# Patient Record
Sex: Female | Born: 1940
Health system: Southern US, Community
[De-identification: ages and names within clinical notes are randomized; demographics above are authoritative.]

## PROBLEM LIST (undated history)

## (undated) DIAGNOSIS — J302 Other seasonal allergic rhinitis: Secondary | ICD-10-CM

## (undated) DIAGNOSIS — M810 Age-related osteoporosis without current pathological fracture: Secondary | ICD-10-CM

## (undated) DIAGNOSIS — I10 Essential (primary) hypertension: Secondary | ICD-10-CM

## (undated) HISTORY — PX: CHOLECYSTECTOMY: SHX55

## (undated) HISTORY — PX: COLONOSCOPY: SHX174

---

## 2001-06-24 ENCOUNTER — Ambulatory Visit (HOSPITAL_COMMUNITY): Admission: RE | Admit: 2001-06-24 | Discharge: 2001-06-24 | Payer: Self-pay | Admitting: Family Medicine

## 2001-06-24 ENCOUNTER — Encounter: Payer: Self-pay | Admitting: Family Medicine

## 2001-07-28 ENCOUNTER — Ambulatory Visit (HOSPITAL_COMMUNITY): Admission: RE | Admit: 2001-07-28 | Discharge: 2001-07-28 | Payer: Self-pay | Admitting: General Surgery

## 2002-09-26 ENCOUNTER — Ambulatory Visit (HOSPITAL_COMMUNITY): Admission: RE | Admit: 2002-09-26 | Discharge: 2002-09-26 | Payer: Self-pay | Admitting: Family Medicine

## 2002-09-26 ENCOUNTER — Encounter: Payer: Self-pay | Admitting: Family Medicine

## 2004-08-02 ENCOUNTER — Ambulatory Visit (HOSPITAL_COMMUNITY): Admission: RE | Admit: 2004-08-02 | Discharge: 2004-08-02 | Payer: Self-pay | Admitting: Family Medicine

## 2006-04-23 ENCOUNTER — Ambulatory Visit (HOSPITAL_COMMUNITY): Admission: RE | Admit: 2006-04-23 | Discharge: 2006-04-23 | Payer: Self-pay | Admitting: Family Medicine

## 2007-05-07 ENCOUNTER — Ambulatory Visit (HOSPITAL_COMMUNITY): Admission: RE | Admit: 2007-05-07 | Discharge: 2007-05-07 | Payer: Self-pay | Admitting: Family Medicine

## 2008-05-30 ENCOUNTER — Ambulatory Visit (HOSPITAL_COMMUNITY): Admission: RE | Admit: 2008-05-30 | Discharge: 2008-05-30 | Payer: Self-pay | Admitting: Family Medicine

## 2008-10-17 ENCOUNTER — Ambulatory Visit (HOSPITAL_COMMUNITY): Admission: RE | Admit: 2008-10-17 | Discharge: 2008-10-17 | Payer: Self-pay | Admitting: Family Medicine

## 2009-06-11 ENCOUNTER — Ambulatory Visit (HOSPITAL_COMMUNITY): Admission: RE | Admit: 2009-06-11 | Discharge: 2009-06-11 | Payer: Self-pay | Admitting: Family Medicine

## 2010-06-24 ENCOUNTER — Ambulatory Visit (HOSPITAL_COMMUNITY): Admission: RE | Admit: 2010-06-24 | Discharge: 2010-06-24 | Payer: Self-pay | Admitting: Family Medicine

## 2011-06-07 ENCOUNTER — Emergency Department (HOSPITAL_COMMUNITY)
Admission: EM | Admit: 2011-06-07 | Discharge: 2011-06-07 | Disposition: A | Discharge status: Home / Self Care | Payer: Medicare Other | Attending: Emergency Medicine | Admitting: Emergency Medicine

## 2011-06-07 DIAGNOSIS — S30860A Insect bite (nonvenomous) of lower back and pelvis, initial encounter: Secondary | ICD-10-CM | POA: Insufficient documentation

## 2011-06-07 DIAGNOSIS — W57XXXA Bitten or stung by nonvenomous insect and other nonvenomous arthropods, initial encounter: Secondary | ICD-10-CM | POA: Insufficient documentation

## 2011-06-07 DIAGNOSIS — E119 Type 2 diabetes mellitus without complications: Secondary | ICD-10-CM | POA: Insufficient documentation

## 2011-06-07 MED ORDER — DOXYCYCLINE HYCLATE 100 MG PO TABS
100.0000 mg | ORAL_TABLET | Freq: Once | ORAL | Status: AC
  Administered 2011-06-07: 100 mg via ORAL
  Filled 2011-06-07: qty 1

## 2011-06-07 MED ORDER — DOXYCYCLINE HYCLATE 100 MG PO CAPS: 100.0000 mg | ORAL_CAPSULE | Freq: Two times a day (BID) | ORAL | Status: AC

## 2011-06-07 NOTE — ED Notes (Signed)
Pt reports possible tick bite to the left side of her back.  Pt has been running a temperature since Thurs.  Pt reports fever up to 102 at home.  The area on pt's back is red and is in the shape of a circle.  Pt reports itching and discomfort.  Pt reports that the area has gotten bigger since Thursday.

## 2011-06-07 NOTE — ED Provider Notes (Signed)
History     CSN: [id number] Arrival date & time: 06/07/2011 10:38 AM  Chief Complaint  Patient presents with  . Insect Bite   HPI Comments: Bit by a tick ~ 1 week ago.  The history is provided by the patient. No language interpreter was used.    Past Medical History  Diagnosis Date  . Diabetes mellitus     History reviewed. No pertinent past surgical history.  No family history on file.  History  Substance Use Topics  . Smoking status: Never Smoker   . Smokeless tobacco: Not on file  . Alcohol Use: No    OB History    Grav Para Term Preterm Abortions TAB SAB Ect Mult Living                  Review of Systems  Skin:       erythema  All other systems reviewed and are negative.    Physical Exam  BP 152/73  Pulse 89  Temp(Src) 98.9 F (37.2 C) (Oral)  Resp 20  Ht 5\' 4"  (1.626 m)  Wt 140 lb (63.504 kg)  BMI 24.03 kg/m2  SpO2 100%  Physical Exam  Nursing note and vitals reviewed. Constitutional: She is oriented to person, place, and time. Vital signs are normal. She appears well-developed and well-nourished. No distress.  HENT:  Head: Normocephalic and atraumatic.  Right Ear: External ear normal.  Left Ear: External ear normal.  Nose: Nose normal.  Mouth/Throat: No oropharyngeal exudate.  Eyes: Conjunctivae and EOM are normal. Pupils are equal, round, and reactive to light. Right eye exhibits no discharge. Left eye exhibits no discharge. No scleral icterus.  Neck: Normal range of motion. Neck supple. No JVD present. No tracheal deviation present. No thyromegaly present.  Cardiovascular: Normal rate, regular rhythm, normal heart sounds, intact distal pulses and normal pulses.  Exam reveals no gallop and no friction rub.   No murmur heard. Pulmonary/Chest: Effort normal and breath sounds normal. No stridor. No respiratory distress. She has no wheezes. She has no rales. She exhibits no tenderness.  Abdominal: Soft. Normal appearance and bowel sounds are  normal. She exhibits no distension and no mass. There is no tenderness. There is no rebound and no guarding.  Musculoskeletal: Normal range of motion. She exhibits no edema and no tenderness.       Arms: Lymphadenopathy:    She has no cervical adenopathy.  Neurological: She is alert and oriented to person, place, and time. She has normal reflexes. No cranial nerve deficit. Coordination normal. GCS eye subscore is 4. GCS verbal subscore is 5. GCS motor subscore is 6.  Reflex Scores:      Tricep reflexes are 2+ on the right side and 2+ on the left side.      Bicep reflexes are 2+ on the right side and 2+ on the left side.      Brachioradialis reflexes are 2+ on the right side and 2+ on the left side.      Patellar reflexes are 2+ on the right side and 2+ on the left side.      Achilles reflexes are 2+ on the right side and 2+ on the left side. Skin: Skin is warm and dry. No rash noted. She is not diaphoretic.  Psychiatric: She has a normal mood and affect. Her speech is normal and behavior is normal. Judgment and thought content normal. Cognition and memory are normal.    ED Course  Procedures  MDM  Worthy Rancher, PA 06/07/11 1204  Medical screening examination/treatment/procedure(s) were performed by non-physician practitioner and as supervising physician I was immediately available for consultation/collaboration.  Cyndra Numbers, MD 09/01/11 1020

## 2011-06-17 NOTE — ED Provider Notes (Signed)
Medical screening examination/treatment/procedure(s) were performed by non-physician practitioner and as supervising physician I was immediately available for consultation/collaboration.  Cyndra Numbers, MD 06/17/11 [phone]

## 2011-07-08 ENCOUNTER — Other Ambulatory Visit (HOSPITAL_COMMUNITY): Payer: Self-pay | Admitting: Family Medicine

## 2011-07-08 DIAGNOSIS — Z139 Encounter for screening, unspecified: Secondary | ICD-10-CM

## 2011-07-17 ENCOUNTER — Ambulatory Visit (HOSPITAL_COMMUNITY): Payer: Medicare Other

## 2011-07-22 ENCOUNTER — Ambulatory Visit (HOSPITAL_COMMUNITY)
Admission: RE | Admit: 2011-07-22 | Discharge: 2011-07-22 | Disposition: A | Discharge status: Home / Self Care | Payer: Medicare Other | Source: Ambulatory Visit | Attending: Family Medicine | Admitting: Family Medicine

## 2011-07-22 DIAGNOSIS — Z1231 Encounter for screening mammogram for malignant neoplasm of breast: Secondary | ICD-10-CM | POA: Insufficient documentation

## 2011-07-22 DIAGNOSIS — Z139 Encounter for screening, unspecified: Secondary | ICD-10-CM

## 2012-05-25 DIAGNOSIS — Z6825 Body mass index (BMI) 25.0-25.9, adult: Secondary | ICD-10-CM | POA: Diagnosis not present

## 2012-05-25 DIAGNOSIS — J019 Acute sinusitis, unspecified: Secondary | ICD-10-CM | POA: Diagnosis not present

## 2012-05-25 DIAGNOSIS — J069 Acute upper respiratory infection, unspecified: Secondary | ICD-10-CM | POA: Diagnosis not present

## 2012-06-16 ENCOUNTER — Other Ambulatory Visit (HOSPITAL_COMMUNITY): Payer: Self-pay | Admitting: Family Medicine

## 2012-06-16 DIAGNOSIS — Z139 Encounter for screening, unspecified: Secondary | ICD-10-CM

## 2012-06-24 DIAGNOSIS — E119 Type 2 diabetes mellitus without complications: Secondary | ICD-10-CM | POA: Diagnosis not present

## 2012-07-26 ENCOUNTER — Ambulatory Visit (HOSPITAL_COMMUNITY)
Admission: RE | Admit: 2012-07-26 | Discharge: 2012-07-26 | Disposition: A | Discharge status: Home / Self Care | Payer: Medicare Other | Source: Ambulatory Visit | Attending: Family Medicine | Admitting: Family Medicine

## 2012-07-26 DIAGNOSIS — Z139 Encounter for screening, unspecified: Secondary | ICD-10-CM

## 2012-07-26 DIAGNOSIS — Z1231 Encounter for screening mammogram for malignant neoplasm of breast: Secondary | ICD-10-CM | POA: Insufficient documentation

## 2012-08-03 ENCOUNTER — Other Ambulatory Visit (HOSPITAL_COMMUNITY): Payer: Self-pay | Admitting: Family Medicine

## 2012-08-03 DIAGNOSIS — Z6825 Body mass index (BMI) 25.0-25.9, adult: Secondary | ICD-10-CM | POA: Diagnosis not present

## 2012-08-03 DIAGNOSIS — Z01419 Encounter for gynecological examination (general) (routine) without abnormal findings: Secondary | ICD-10-CM

## 2012-08-03 DIAGNOSIS — Z0000 Encounter for general adult medical examination without abnormal findings: Secondary | ICD-10-CM | POA: Diagnosis not present

## 2012-08-03 DIAGNOSIS — I10 Essential (primary) hypertension: Secondary | ICD-10-CM | POA: Diagnosis not present

## 2012-08-03 DIAGNOSIS — Z139 Encounter for screening, unspecified: Secondary | ICD-10-CM

## 2012-08-03 DIAGNOSIS — E785 Hyperlipidemia, unspecified: Secondary | ICD-10-CM | POA: Diagnosis not present

## 2012-08-03 DIAGNOSIS — E119 Type 2 diabetes mellitus without complications: Secondary | ICD-10-CM | POA: Diagnosis not present

## 2012-08-05 DIAGNOSIS — R7989 Other specified abnormal findings of blood chemistry: Secondary | ICD-10-CM | POA: Diagnosis not present

## 2012-08-10 ENCOUNTER — Ambulatory Visit (HOSPITAL_COMMUNITY)
Admission: RE | Admit: 2012-08-10 | Discharge: 2012-08-10 | Disposition: A | Discharge status: Home / Self Care | Payer: Medicare Other | Source: Ambulatory Visit | Attending: Family Medicine | Admitting: Family Medicine

## 2012-08-10 DIAGNOSIS — M899 Disorder of bone, unspecified: Secondary | ICD-10-CM | POA: Diagnosis not present

## 2012-08-10 DIAGNOSIS — Z139 Encounter for screening, unspecified: Secondary | ICD-10-CM

## 2012-08-10 DIAGNOSIS — M810 Age-related osteoporosis without current pathological fracture: Secondary | ICD-10-CM | POA: Insufficient documentation

## 2012-08-10 DIAGNOSIS — Z01419 Encounter for gynecological examination (general) (routine) without abnormal findings: Secondary | ICD-10-CM

## 2012-08-13 ENCOUNTER — Encounter (INDEPENDENT_AMBULATORY_CARE_PROVIDER_SITE_OTHER): Payer: Self-pay | Admitting: *Deleted

## 2012-10-04 DIAGNOSIS — E210 Primary hyperparathyroidism: Secondary | ICD-10-CM | POA: Diagnosis not present

## 2012-10-07 DIAGNOSIS — E210 Primary hyperparathyroidism: Secondary | ICD-10-CM | POA: Diagnosis not present

## 2012-11-19 ENCOUNTER — Encounter (INDEPENDENT_AMBULATORY_CARE_PROVIDER_SITE_OTHER): Payer: Self-pay | Admitting: *Deleted

## 2012-11-19 ENCOUNTER — Telehealth (INDEPENDENT_AMBULATORY_CARE_PROVIDER_SITE_OTHER): Payer: Self-pay | Admitting: *Deleted

## 2012-11-19 ENCOUNTER — Other Ambulatory Visit (INDEPENDENT_AMBULATORY_CARE_PROVIDER_SITE_OTHER): Payer: Self-pay | Admitting: *Deleted

## 2012-11-19 DIAGNOSIS — Z1211 Encounter for screening for malignant neoplasm of colon: Secondary | ICD-10-CM

## 2012-11-19 MED ORDER — PEG-KCL-NACL-NASULF-NA ASC-C 100 G PO SOLR: 1.0000 | Freq: Once | ORAL | Status: DC

## 2012-11-19 NOTE — Telephone Encounter (Signed)
Patient needs movi prep 

## 2012-12-01 ENCOUNTER — Telehealth (INDEPENDENT_AMBULATORY_CARE_PROVIDER_SITE_OTHER): Payer: Self-pay | Admitting: *Deleted

## 2012-12-01 NOTE — Telephone Encounter (Signed)
  Procedure: tcs  Reason/Indication:  Screening, fam hx colon ca  Has patient had this procedure before?  Yes, 11 years ago  If so, when, by whom and where?    Is there a family history of colon cancer?  Yes, mother  Who?  What age when diagnosed?    Is patient diabetic?   Yes, no meds, diet controlled      Does patient have prosthetic heart valve?  no  Do you have a pacemaker?  no  Has patient had joint replacement within last 12 months?  no  Is patient on Coumadin, Plavix and/or Aspirin? no  Medications: lisinopril 5 mg daily  Allergies: see EPIC  Medication Adjustment:   Procedure date & time: 12/30/12 at 830

## 2012-12-02 NOTE — Telephone Encounter (Signed)
agree

## 2012-12-20 ENCOUNTER — Encounter (HOSPITAL_COMMUNITY): Payer: Self-pay | Admitting: Pharmacy Technician

## 2012-12-29 MED ORDER — SODIUM CHLORIDE 0.45 % IV SOLN: INTRAVENOUS | Status: DC

## 2012-12-30 ENCOUNTER — Encounter (HOSPITAL_COMMUNITY)
Admission: RE | Disposition: A | Discharge status: Home / Self Care | Payer: Self-pay | Source: Ambulatory Visit | Attending: Internal Medicine

## 2012-12-30 ENCOUNTER — Ambulatory Visit (HOSPITAL_COMMUNITY)
Admission: RE | Admit: 2012-12-30 | Discharge: 2012-12-30 | Disposition: A | Discharge status: Home / Self Care | Payer: Medicare Other | Source: Ambulatory Visit | Attending: Internal Medicine | Admitting: Internal Medicine

## 2012-12-30 ENCOUNTER — Encounter (HOSPITAL_COMMUNITY): Payer: Self-pay | Admitting: *Deleted

## 2012-12-30 DIAGNOSIS — Z01812 Encounter for preprocedural laboratory examination: Secondary | ICD-10-CM | POA: Diagnosis not present

## 2012-12-30 DIAGNOSIS — E119 Type 2 diabetes mellitus without complications: Secondary | ICD-10-CM | POA: Insufficient documentation

## 2012-12-30 DIAGNOSIS — I10 Essential (primary) hypertension: Secondary | ICD-10-CM | POA: Diagnosis not present

## 2012-12-30 DIAGNOSIS — Z800 Family history of malignant neoplasm of digestive organs: Secondary | ICD-10-CM | POA: Insufficient documentation

## 2012-12-30 DIAGNOSIS — Z1211 Encounter for screening for malignant neoplasm of colon: Secondary | ICD-10-CM | POA: Diagnosis not present

## 2012-12-30 HISTORY — PX: COLONOSCOPY: SHX5424

## 2012-12-30 HISTORY — DX: Age-related osteoporosis without current pathological fracture: M81.0

## 2012-12-30 HISTORY — DX: Other seasonal allergic rhinitis: J30.2

## 2012-12-30 HISTORY — DX: Essential (primary) hypertension: I10

## 2012-12-30 LAB — GLUCOSE, CAPILLARY: Glucose-Capillary: 78 mg/dL (ref 70–99)

## 2012-12-30 SURGERY — COLONOSCOPY
Anesthesia: Moderate Sedation

## 2012-12-30 MED ORDER — MIDAZOLAM HCL 5 MG/5ML IJ SOLN
INTRAMUSCULAR | Status: DC | PRN
  Administered 2012-12-30 (×2): 2 mg via INTRAVENOUS
  Administered 2012-12-30: 1 mg via INTRAVENOUS

## 2012-12-30 MED ORDER — MIDAZOLAM HCL 5 MG/5ML IJ SOLN
INTRAMUSCULAR | Status: AC
  Filled 2012-12-30: qty 10

## 2012-12-30 MED ORDER — MEPERIDINE HCL 50 MG/ML IJ SOLN
INTRAMUSCULAR | Status: DC | PRN
  Administered 2012-12-30 (×2): 25 mg via INTRAVENOUS

## 2012-12-30 MED ORDER — STERILE WATER FOR IRRIGATION IR SOLN
Status: DC | PRN
  Administered 2012-12-30: 08:00:00

## 2012-12-30 MED ORDER — MEPERIDINE HCL 50 MG/ML IJ SOLN
INTRAMUSCULAR | Status: AC
  Filled 2012-12-30: qty 1

## 2012-12-30 MED ORDER — SODIUM CHLORIDE 0.9 % IV SOLN
INTRAVENOUS | Status: DC
  Administered 2012-12-30: 08:00:00 via INTRAVENOUS

## 2012-12-30 NOTE — H&P (Signed)
Bethany Herring is an 72 y.o. female.   Chief Complaint: Patient is here for colonoscopy. HPI: Patient is 72 year old Caucasian female is here for screening colonoscopy. Her last exam was 11 years ago. She denies change in her bowel habits or rectal bleeding. At times she does past clear mucus per rectum. Her mother was diagnosed with colon carcinoma when she was in her late 8s. No other member of the family has had colonic polyps or carcinoma.  Past Medical History  Diagnosis Date  . Hypertension   . Osteoporosis   . Seasonal allergies   . Diabetes mellitus     Diet controlled    Past Surgical History  Procedure Laterality Date  . Cholecystectomy    . Colonoscopy      Family History  Problem Relation Age of Onset  . Colon cancer Mother    Social History:  reports that she has never smoked. She does not have any smokeless tobacco history on file. She reports that  drinks alcohol. She reports that she does not use illicit drugs.  Allergies:  Allergies  Allergen Reactions  . Amoxicillin Hives  . Cortisone     Highly elevated blood sugars   . Glucotrol (Glipizide)     Medications Prior to Admission  Medication Sig Dispense Refill  . calcium carbonate (TUMS - DOSED IN MG ELEMENTAL CALCIUM) 500 MG chewable tablet Chew 1 tablet by mouth 2 (two) times daily as needed for heartburn.      . cholecalciferol (VITAMIN D) 400 UNITS TABS Take 400 Units by mouth daily.        Marland Kitchen CRANBERRY PO Take 1 tablet by mouth daily.      Marland Kitchen lisinopril (PRINIVIL,ZESTRIL) 5 MG tablet Take 5 mg by mouth daily.        Marland Kitchen OVER THE COUNTER MEDICATION Take 5 mLs by mouth daily. 1 teaspoon before breakfast. Powdered form Raw Barley grass      . Probiotic Product (ALIGN) 4 MG CAPS Take 1 tablet by mouth daily.        . vitamin C (ASCORBIC ACID) 500 MG tablet Take 500 mg by mouth daily.          Results for orders placed during the hospital encounter of 12/30/12 (from the past 48 hour(s))  GLUCOSE,  CAPILLARY     Status: None   Collection Time    12/30/12  8:08 AM      Result Value Range   Glucose-Capillary 78  70 - 99 mg/dL   Comment 1 Documented in Chart     No results found.  ROS  Blood pressure 158/77, pulse 90, temperature 97.8 F (36.6 C), temperature source Oral, resp. rate 18, height 5\' 3"  (1.6 m), weight 140 lb (63.504 kg), SpO2 99.00%. Physical Exam  Constitutional: She appears well-developed and well-nourished.  HENT:  Mouth/Throat: Oropharynx is clear and moist.  Eyes: Conjunctivae are normal. No scleral icterus.  Neck: No thyromegaly present.  Cardiovascular: Normal rate, regular rhythm and normal heart sounds.   No murmur heard. Respiratory: Effort normal and breath sounds normal.  GI: Soft. She exhibits no distension and no mass. There is no tenderness.  Long right subcostal scar  Musculoskeletal: She exhibits no edema.  Lymphadenopathy:    She has no cervical adenopathy.  Neurological: She is alert.  Skin: Skin is warm and dry.     Assessment/Plan Average risk screening colonoscopy. Family history of colon carcinoma in a first-degree relative at late onset.  REHMAN,NAJEEB U 12/30/2012,  8:32 AM

## 2012-12-30 NOTE — Op Note (Signed)
COLONOSCOPY PROCEDURE REPORT  PATIENT:  Bethany Herring  MR#:  [id number] Birthdate:  18-Nov-1940, 72 y.o., female Endoscopist:  Dr. Malissa Hippo, MD Referred By:  Dr. Colette Ribas, MD  Procedure Date: 12/30/2012  Procedure:   Colonoscopy  Indications:  Patient is 72 year old Caucasian female was undergoing screening colonoscopy. Her mother was diagnosed with colon carcinoma in her mid 48s. Patient's last colonoscopy was 11 years ago and was unremarkable.  Informed Consent:  The procedure and risks were reviewed with the patient and informed consent was obtained.  Medications:  Demerol 50 mg IV Versed 5 mg IV  Description of procedure:  After a digital rectal exam was performed, that colonoscope was advanced from the anus through the rectum and colon to the area of the cecum, ileocecal valve and appendiceal orifice. The cecum was deeply intubated. These structures were well-seen and photographed for the record. From the level of the cecum and ileocecal valve, the scope was slowly and cautiously withdrawn. The mucosal surfaces were carefully surveyed utilizing scope tip to flexion to facilitate fold flattening as needed. The scope was pulled down into the rectum where a thorough exam including retroflexion was performed.  Findings:   Prep satisfactory. She had a lot of liquid stool scattered throughout the colon. Normal mucosa throughout. No polyps or other abnormalities noted. Normal rectal mucosa and anal rectal junction.   Therapeutic/Diagnostic Maneuvers Performed:  None  Complications:  None  Cecal Withdrawal Time:  11 minutes  Impression:  Normal colonoscopy.  Recommendations:  Standard instructions given. Next screening exam in 10 years unless family history changes.  Ashland Wiseman U  12/30/2012 9:11 AM  CC: Dr. Phillips Odor, Chancy Hurter, MD & Dr. Bonnetta Barry ref. provider found

## 2013-01-03 ENCOUNTER — Encounter (HOSPITAL_COMMUNITY): Payer: Self-pay | Admitting: Internal Medicine

## 2013-08-01 DIAGNOSIS — E119 Type 2 diabetes mellitus without complications: Secondary | ICD-10-CM | POA: Diagnosis not present

## 2013-09-15 DIAGNOSIS — Z23 Encounter for immunization: Secondary | ICD-10-CM | POA: Diagnosis not present

## 2013-10-11 DIAGNOSIS — I10 Essential (primary) hypertension: Secondary | ICD-10-CM | POA: Diagnosis not present

## 2013-10-11 DIAGNOSIS — Z6826 Body mass index (BMI) 26.0-26.9, adult: Secondary | ICD-10-CM | POA: Diagnosis not present

## 2013-10-11 DIAGNOSIS — E785 Hyperlipidemia, unspecified: Secondary | ICD-10-CM | POA: Diagnosis not present

## 2014-07-17 DIAGNOSIS — E119 Type 2 diabetes mellitus without complications: Secondary | ICD-10-CM | POA: Diagnosis not present

## 2015-03-06 DIAGNOSIS — I10 Essential (primary) hypertension: Secondary | ICD-10-CM | POA: Diagnosis not present

## 2015-03-06 DIAGNOSIS — R7309 Other abnormal glucose: Secondary | ICD-10-CM | POA: Diagnosis not present

## 2015-03-06 DIAGNOSIS — Z1389 Encounter for screening for other disorder: Secondary | ICD-10-CM | POA: Diagnosis not present

## 2015-03-06 DIAGNOSIS — Z23 Encounter for immunization: Secondary | ICD-10-CM | POA: Diagnosis not present

## 2015-03-06 DIAGNOSIS — Z0000 Encounter for general adult medical examination without abnormal findings: Secondary | ICD-10-CM | POA: Diagnosis not present

## 2015-06-12 ENCOUNTER — Other Ambulatory Visit (HOSPITAL_COMMUNITY): Payer: Self-pay | Admitting: Family Medicine

## 2015-06-12 DIAGNOSIS — Z1231 Encounter for screening mammogram for malignant neoplasm of breast: Secondary | ICD-10-CM

## 2015-06-20 ENCOUNTER — Ambulatory Visit (HOSPITAL_COMMUNITY)
Admission: RE | Admit: 2015-06-20 | Discharge: 2015-06-20 | Disposition: A | Discharge status: Home / Self Care | Payer: Medicare Other | Source: Ambulatory Visit | Attending: Family Medicine | Admitting: Family Medicine

## 2015-06-20 DIAGNOSIS — Z1231 Encounter for screening mammogram for malignant neoplasm of breast: Secondary | ICD-10-CM | POA: Insufficient documentation

## 2015-11-14 DIAGNOSIS — E11319 Type 2 diabetes mellitus with unspecified diabetic retinopathy without macular edema: Secondary | ICD-10-CM | POA: Diagnosis not present

## 2016-05-09 ENCOUNTER — Other Ambulatory Visit (HOSPITAL_COMMUNITY): Payer: Self-pay | Admitting: Family Medicine

## 2016-05-09 DIAGNOSIS — Z1231 Encounter for screening mammogram for malignant neoplasm of breast: Secondary | ICD-10-CM

## 2016-05-13 DIAGNOSIS — E569 Vitamin deficiency, unspecified: Secondary | ICD-10-CM | POA: Diagnosis not present

## 2016-05-13 DIAGNOSIS — E119 Type 2 diabetes mellitus without complications: Secondary | ICD-10-CM | POA: Diagnosis not present

## 2016-05-13 DIAGNOSIS — N189 Chronic kidney disease, unspecified: Secondary | ICD-10-CM | POA: Diagnosis not present

## 2016-05-13 DIAGNOSIS — E559 Vitamin D deficiency, unspecified: Secondary | ICD-10-CM | POA: Diagnosis not present

## 2016-05-13 DIAGNOSIS — Z1389 Encounter for screening for other disorder: Secondary | ICD-10-CM | POA: Diagnosis not present

## 2016-05-13 DIAGNOSIS — Z0000 Encounter for general adult medical examination without abnormal findings: Secondary | ICD-10-CM | POA: Diagnosis not present

## 2016-05-13 DIAGNOSIS — L720 Epidermal cyst: Secondary | ICD-10-CM | POA: Diagnosis not present

## 2016-05-13 DIAGNOSIS — Z6826 Body mass index (BMI) 26.0-26.9, adult: Secondary | ICD-10-CM | POA: Diagnosis not present

## 2016-05-13 DIAGNOSIS — E782 Mixed hyperlipidemia: Secondary | ICD-10-CM | POA: Diagnosis not present

## 2016-05-13 DIAGNOSIS — E663 Overweight: Secondary | ICD-10-CM | POA: Diagnosis not present

## 2016-06-12 DIAGNOSIS — S1086XA Insect bite of other specified part of neck, initial encounter: Secondary | ICD-10-CM | POA: Diagnosis not present

## 2016-06-12 DIAGNOSIS — M67432 Ganglion, left wrist: Secondary | ICD-10-CM | POA: Diagnosis not present

## 2016-06-12 DIAGNOSIS — M71341 Other bursal cyst, right hand: Secondary | ICD-10-CM | POA: Diagnosis not present

## 2016-06-12 DIAGNOSIS — M67441 Ganglion, right hand: Secondary | ICD-10-CM | POA: Diagnosis not present

## 2016-06-20 ENCOUNTER — Ambulatory Visit (HOSPITAL_COMMUNITY): Payer: Medicare Other

## 2016-07-07 ENCOUNTER — Ambulatory Visit (HOSPITAL_COMMUNITY)
Admission: RE | Admit: 2016-07-07 | Discharge: 2016-07-07 | Disposition: A | Discharge status: Home / Self Care | Payer: Medicare Other | Source: Ambulatory Visit | Attending: Family Medicine | Admitting: Family Medicine

## 2016-07-07 DIAGNOSIS — Z1231 Encounter for screening mammogram for malignant neoplasm of breast: Secondary | ICD-10-CM | POA: Diagnosis not present

## 2016-10-22 DIAGNOSIS — Z23 Encounter for immunization: Secondary | ICD-10-CM | POA: Diagnosis not present

## 2017-02-04 DIAGNOSIS — N189 Chronic kidney disease, unspecified: Secondary | ICD-10-CM | POA: Diagnosis not present

## 2017-02-04 DIAGNOSIS — E119 Type 2 diabetes mellitus without complications: Secondary | ICD-10-CM | POA: Diagnosis not present

## 2017-02-04 DIAGNOSIS — I10 Essential (primary) hypertension: Secondary | ICD-10-CM | POA: Diagnosis not present

## 2017-02-04 DIAGNOSIS — E569 Vitamin deficiency, unspecified: Secondary | ICD-10-CM | POA: Diagnosis not present

## 2017-02-04 DIAGNOSIS — D559 Anemia due to enzyme disorder, unspecified: Secondary | ICD-10-CM | POA: Diagnosis not present

## 2017-02-04 DIAGNOSIS — Z6825 Body mass index (BMI) 25.0-25.9, adult: Secondary | ICD-10-CM | POA: Diagnosis not present

## 2017-02-04 DIAGNOSIS — E782 Mixed hyperlipidemia: Secondary | ICD-10-CM | POA: Diagnosis not present

## 2017-02-04 DIAGNOSIS — M810 Age-related osteoporosis without current pathological fracture: Secondary | ICD-10-CM | POA: Diagnosis not present

## 2017-02-04 DIAGNOSIS — E663 Overweight: Secondary | ICD-10-CM | POA: Diagnosis not present

## 2017-03-31 DIAGNOSIS — Z961 Presence of intraocular lens: Secondary | ICD-10-CM | POA: Diagnosis not present

## 2017-03-31 DIAGNOSIS — H2703 Aphakia, bilateral: Secondary | ICD-10-CM | POA: Diagnosis not present

## 2017-03-31 DIAGNOSIS — H40013 Open angle with borderline findings, low risk, bilateral: Secondary | ICD-10-CM | POA: Diagnosis not present

## 2017-07-01 ENCOUNTER — Other Ambulatory Visit (HOSPITAL_COMMUNITY): Payer: Self-pay | Admitting: Family Medicine

## 2017-07-01 DIAGNOSIS — Z1231 Encounter for screening mammogram for malignant neoplasm of breast: Secondary | ICD-10-CM

## 2017-07-27 ENCOUNTER — Encounter (HOSPITAL_COMMUNITY): Payer: Self-pay

## 2017-07-27 ENCOUNTER — Ambulatory Visit (HOSPITAL_COMMUNITY)
Admission: RE | Admit: 2017-07-27 | Discharge: 2017-07-27 | Disposition: A | Discharge status: Home / Self Care | Payer: Medicare Other | Source: Ambulatory Visit | Attending: Family Medicine | Admitting: Family Medicine

## 2017-07-27 DIAGNOSIS — Z1231 Encounter for screening mammogram for malignant neoplasm of breast: Secondary | ICD-10-CM | POA: Diagnosis not present

## 2017-10-01 DIAGNOSIS — Z23 Encounter for immunization: Secondary | ICD-10-CM | POA: Diagnosis not present

## 2017-11-11 DIAGNOSIS — E119 Type 2 diabetes mellitus without complications: Secondary | ICD-10-CM | POA: Diagnosis not present

## 2017-11-11 DIAGNOSIS — E663 Overweight: Secondary | ICD-10-CM | POA: Diagnosis not present

## 2017-11-11 DIAGNOSIS — Z6826 Body mass index (BMI) 26.0-26.9, adult: Secondary | ICD-10-CM | POA: Diagnosis not present

## 2017-11-11 DIAGNOSIS — R5383 Other fatigue: Secondary | ICD-10-CM | POA: Diagnosis not present

## 2017-11-11 DIAGNOSIS — R05 Cough: Secondary | ICD-10-CM | POA: Diagnosis not present

## 2017-11-11 DIAGNOSIS — Z1389 Encounter for screening for other disorder: Secondary | ICD-10-CM | POA: Diagnosis not present

## 2017-11-11 DIAGNOSIS — Z0000 Encounter for general adult medical examination without abnormal findings: Secondary | ICD-10-CM | POA: Diagnosis not present

## 2017-11-11 DIAGNOSIS — I10 Essential (primary) hypertension: Secondary | ICD-10-CM | POA: Diagnosis not present

## 2017-11-11 DIAGNOSIS — J029 Acute pharyngitis, unspecified: Secondary | ICD-10-CM | POA: Diagnosis not present

## 2017-11-11 DIAGNOSIS — R51 Headache: Secondary | ICD-10-CM | POA: Diagnosis not present

## 2018-02-09 DIAGNOSIS — E663 Overweight: Secondary | ICD-10-CM | POA: Diagnosis not present

## 2018-02-09 DIAGNOSIS — E782 Mixed hyperlipidemia: Secondary | ICD-10-CM | POA: Diagnosis not present

## 2018-02-09 DIAGNOSIS — Z23 Encounter for immunization: Secondary | ICD-10-CM | POA: Diagnosis not present

## 2018-02-09 DIAGNOSIS — I10 Essential (primary) hypertension: Secondary | ICD-10-CM | POA: Diagnosis not present

## 2018-02-09 DIAGNOSIS — E119 Type 2 diabetes mellitus without complications: Secondary | ICD-10-CM | POA: Diagnosis not present

## 2018-02-09 DIAGNOSIS — M810 Age-related osteoporosis without current pathological fracture: Secondary | ICD-10-CM | POA: Diagnosis not present

## 2018-02-09 DIAGNOSIS — Z79899 Other long term (current) drug therapy: Secondary | ICD-10-CM | POA: Diagnosis not present

## 2018-02-09 DIAGNOSIS — E569 Vitamin deficiency, unspecified: Secondary | ICD-10-CM | POA: Diagnosis not present

## 2018-02-09 DIAGNOSIS — N189 Chronic kidney disease, unspecified: Secondary | ICD-10-CM | POA: Diagnosis not present

## 2018-02-09 DIAGNOSIS — Z6825 Body mass index (BMI) 25.0-25.9, adult: Secondary | ICD-10-CM | POA: Diagnosis not present

## 2018-05-13 DIAGNOSIS — E119 Type 2 diabetes mellitus without complications: Secondary | ICD-10-CM | POA: Diagnosis not present

## 2018-05-13 DIAGNOSIS — Z961 Presence of intraocular lens: Secondary | ICD-10-CM | POA: Diagnosis not present

## 2018-05-13 DIAGNOSIS — H47311 Coloboma of optic disc, right eye: Secondary | ICD-10-CM | POA: Diagnosis not present

## 2018-09-01 ENCOUNTER — Other Ambulatory Visit (HOSPITAL_COMMUNITY): Payer: Self-pay | Admitting: Family Medicine

## 2018-09-01 DIAGNOSIS — Z1231 Encounter for screening mammogram for malignant neoplasm of breast: Secondary | ICD-10-CM

## 2018-09-17 DIAGNOSIS — E119 Type 2 diabetes mellitus without complications: Secondary | ICD-10-CM | POA: Diagnosis not present

## 2018-09-17 DIAGNOSIS — Z1389 Encounter for screening for other disorder: Secondary | ICD-10-CM | POA: Diagnosis not present

## 2018-09-17 DIAGNOSIS — E663 Overweight: Secondary | ICD-10-CM | POA: Diagnosis not present

## 2018-09-17 DIAGNOSIS — I10 Essential (primary) hypertension: Secondary | ICD-10-CM | POA: Diagnosis not present

## 2018-09-17 DIAGNOSIS — E569 Vitamin deficiency, unspecified: Secondary | ICD-10-CM | POA: Diagnosis not present

## 2018-09-17 DIAGNOSIS — E782 Mixed hyperlipidemia: Secondary | ICD-10-CM | POA: Diagnosis not present

## 2018-09-17 DIAGNOSIS — Z6825 Body mass index (BMI) 25.0-25.9, adult: Secondary | ICD-10-CM | POA: Diagnosis not present

## 2018-09-17 DIAGNOSIS — Z23 Encounter for immunization: Secondary | ICD-10-CM | POA: Diagnosis not present

## 2018-09-27 ENCOUNTER — Ambulatory Visit (HOSPITAL_COMMUNITY)
Admission: RE | Admit: 2018-09-27 | Discharge: 2018-09-27 | Disposition: A | Discharge status: Home / Self Care | Payer: Medicare Other | Source: Ambulatory Visit | Attending: Family Medicine | Admitting: Family Medicine

## 2018-09-27 DIAGNOSIS — Z1231 Encounter for screening mammogram for malignant neoplasm of breast: Secondary | ICD-10-CM | POA: Diagnosis not present

## 2019-02-16 DIAGNOSIS — Z681 Body mass index (BMI) 19 or less, adult: Secondary | ICD-10-CM | POA: Diagnosis not present

## 2019-02-16 DIAGNOSIS — Z0000 Encounter for general adult medical examination without abnormal findings: Secondary | ICD-10-CM | POA: Diagnosis not present

## 2019-02-16 DIAGNOSIS — Z1389 Encounter for screening for other disorder: Secondary | ICD-10-CM | POA: Diagnosis not present

## 2019-08-11 DIAGNOSIS — Z23 Encounter for immunization: Secondary | ICD-10-CM | POA: Diagnosis not present

## 2019-08-11 DIAGNOSIS — E559 Vitamin D deficiency, unspecified: Secondary | ICD-10-CM | POA: Diagnosis not present

## 2019-08-11 DIAGNOSIS — E663 Overweight: Secondary | ICD-10-CM | POA: Diagnosis not present

## 2019-08-11 DIAGNOSIS — E119 Type 2 diabetes mellitus without complications: Secondary | ICD-10-CM | POA: Diagnosis not present

## 2019-08-11 DIAGNOSIS — E7849 Other hyperlipidemia: Secondary | ICD-10-CM | POA: Diagnosis not present

## 2019-08-11 DIAGNOSIS — Z6826 Body mass index (BMI) 26.0-26.9, adult: Secondary | ICD-10-CM | POA: Diagnosis not present

## 2019-08-11 DIAGNOSIS — I10 Essential (primary) hypertension: Secondary | ICD-10-CM | POA: Diagnosis not present

## 2019-12-03 ENCOUNTER — Ambulatory Visit: Payer: Medicare Other | Attending: Internal Medicine

## 2019-12-03 DIAGNOSIS — Z23 Encounter for immunization: Secondary | ICD-10-CM

## 2019-12-03 NOTE — Progress Notes (Signed)
   Covid-19 Vaccination Clinic  Name:  DEWEY NEUKAM    MRN: [id number] DOB: 05-30-1941  12/03/2019  Ms. Nordmann was observed post Covid-19 immunization for 15 minutes without incident. She was provided with Vaccine Information Sheet and instruction to access the V-Safe system.   Ms. Saperstein was instructed to call 911 with any severe reactions post vaccine: Marland Kitchen Difficulty breathing  . Swelling of face and throat  . A fast heartbeat  . A bad rash all over body  . Dizziness and weakness   Immunizations Administered    Name Date Dose VIS Date Route   Moderna COVID-19 Vaccine 12/03/2019  8:35 AM 0.5 mL 08/30/2019 Intramuscular   Manufacturer: Moderna   Lot: [id number]   NDC: 78676-720-94

## 2019-12-12 IMAGING — MG DIGITAL SCREENING BILATERAL MAMMOGRAM WITH TOMO AND CAD
8 series · 8 of 24 positions shown · non-contrast
Comparison: Previous exam(s).

CLINICAL DATA: Screening.

EXAM:
DIGITAL SCREENING BILATERAL MAMMOGRAM WITH TOMO AND CAD

[R CC synth-2D]
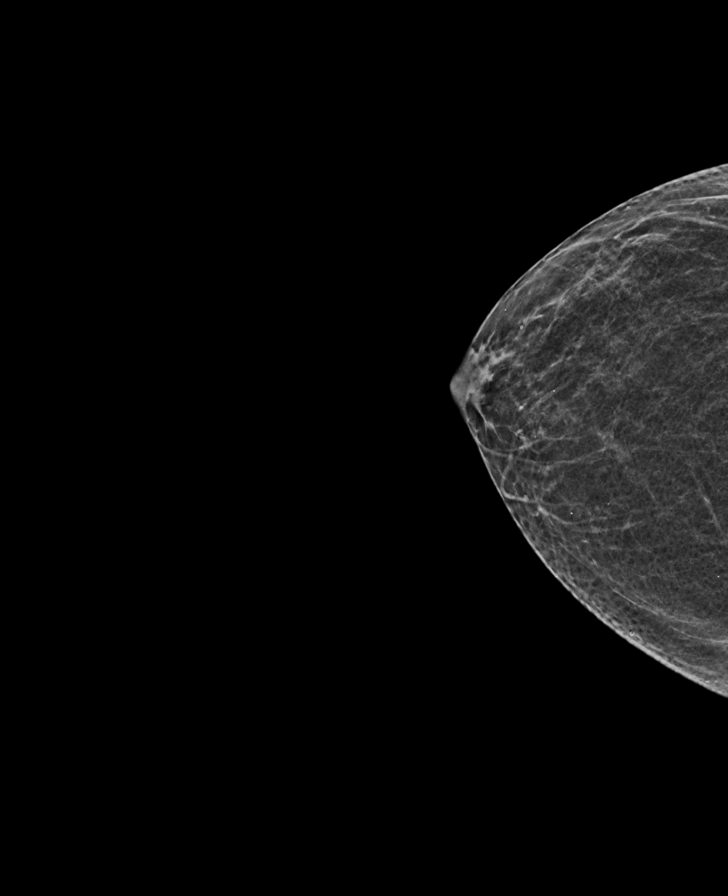

[L MLO synth-2D]
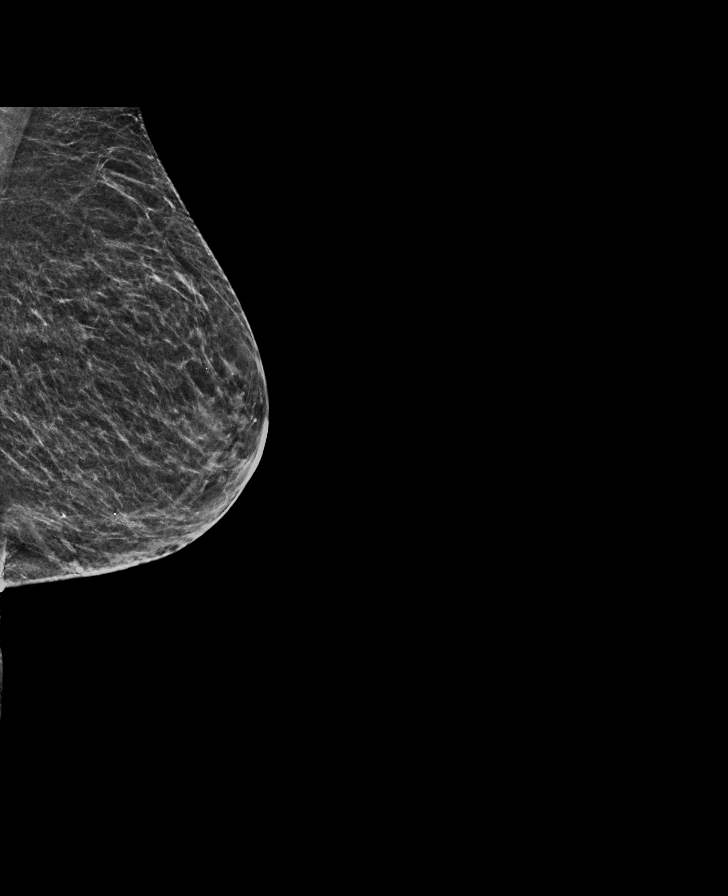

[R MLO synth-2D]
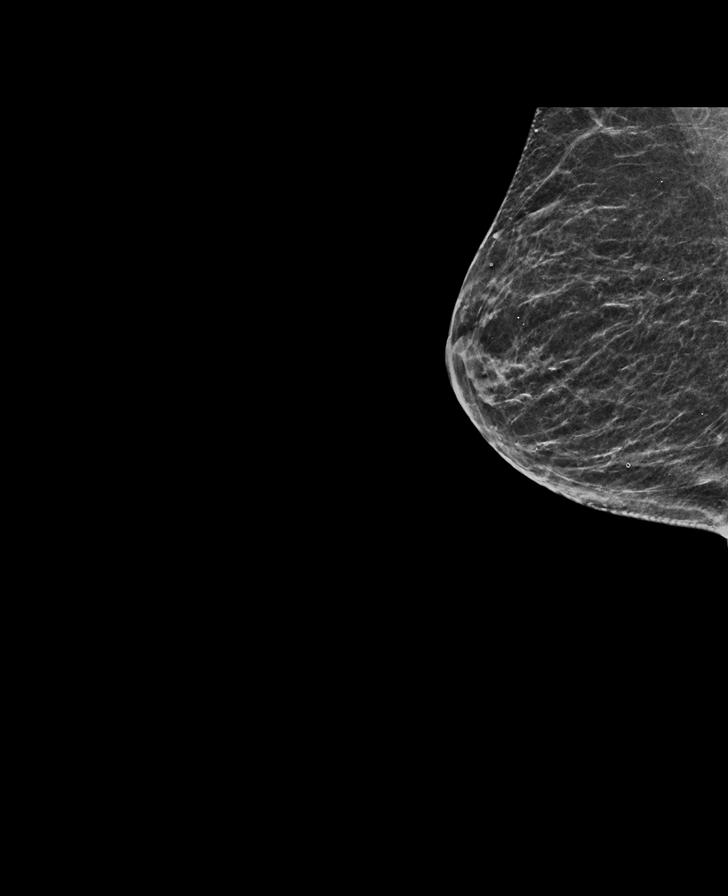

[L CC synth-2D]
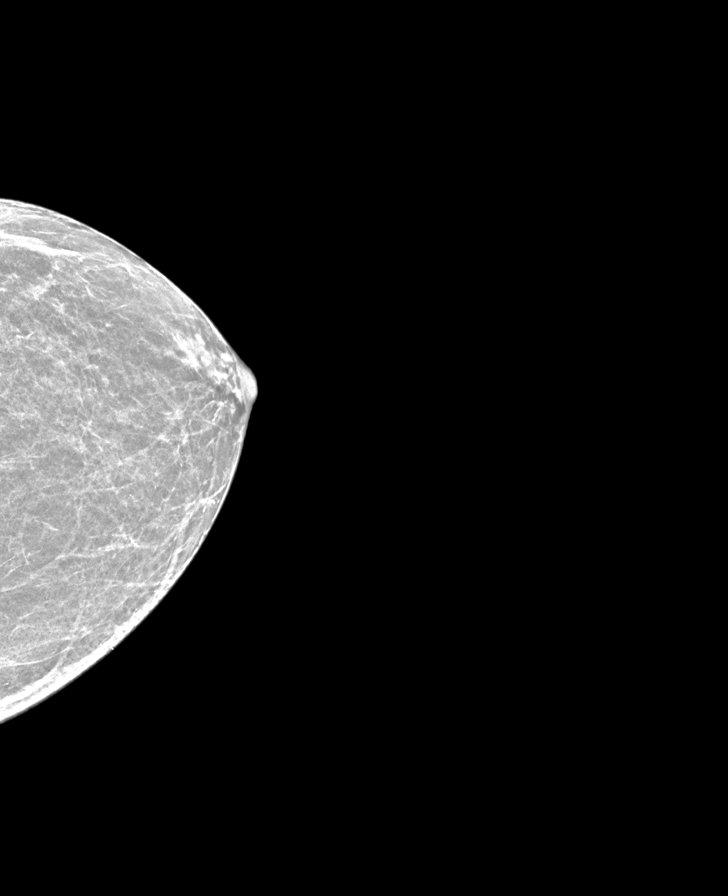

[L MLO tomo · tomo slice 25/48.0]
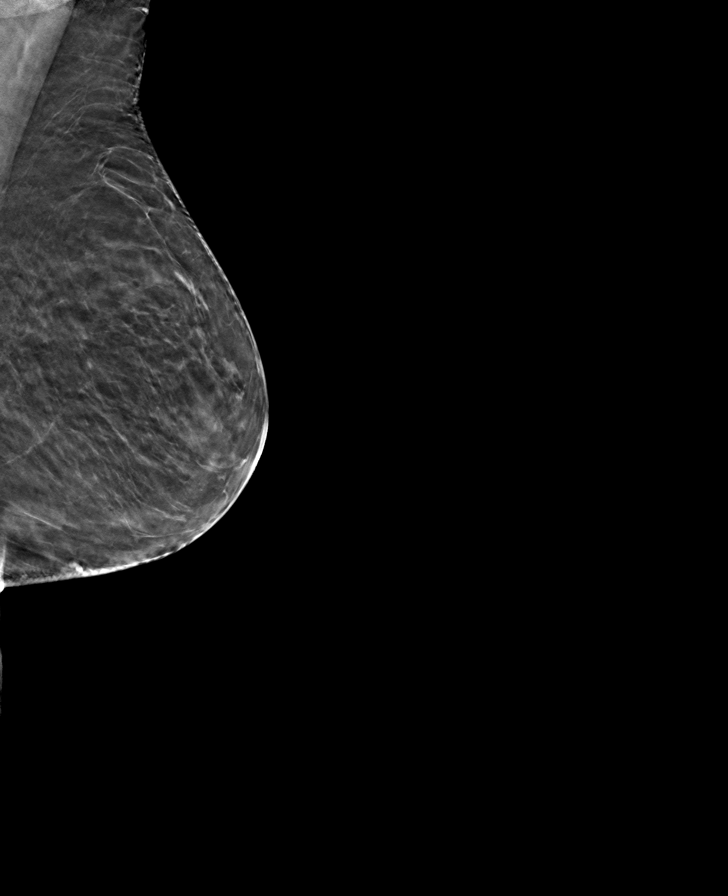

[R CC tomo · tomo slice 21/41.0]
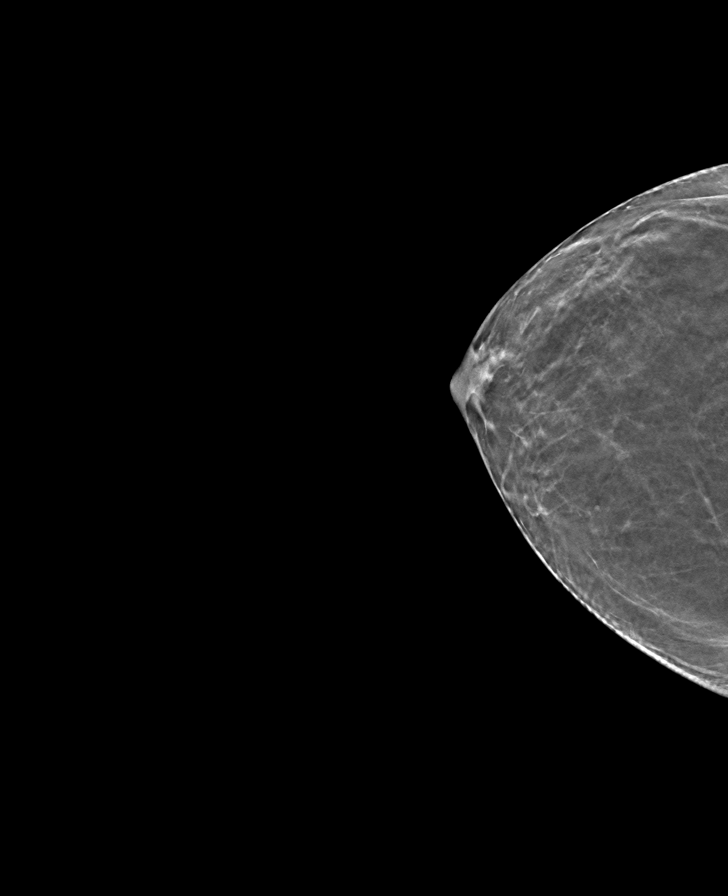

[L CC tomo · tomo slice 21/41.0]
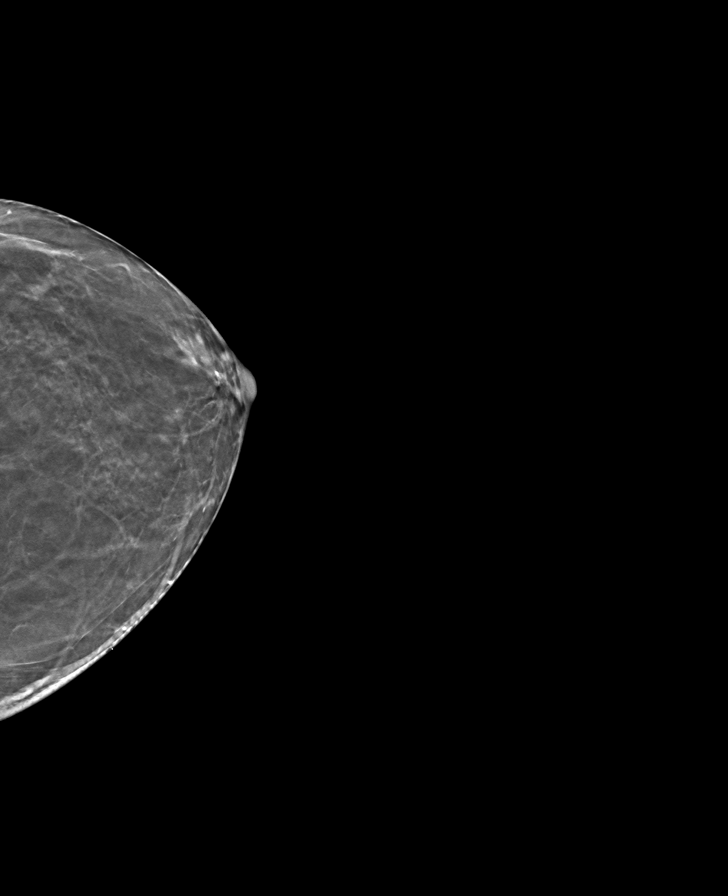

[R MLO tomo · tomo slice 25/48.0]
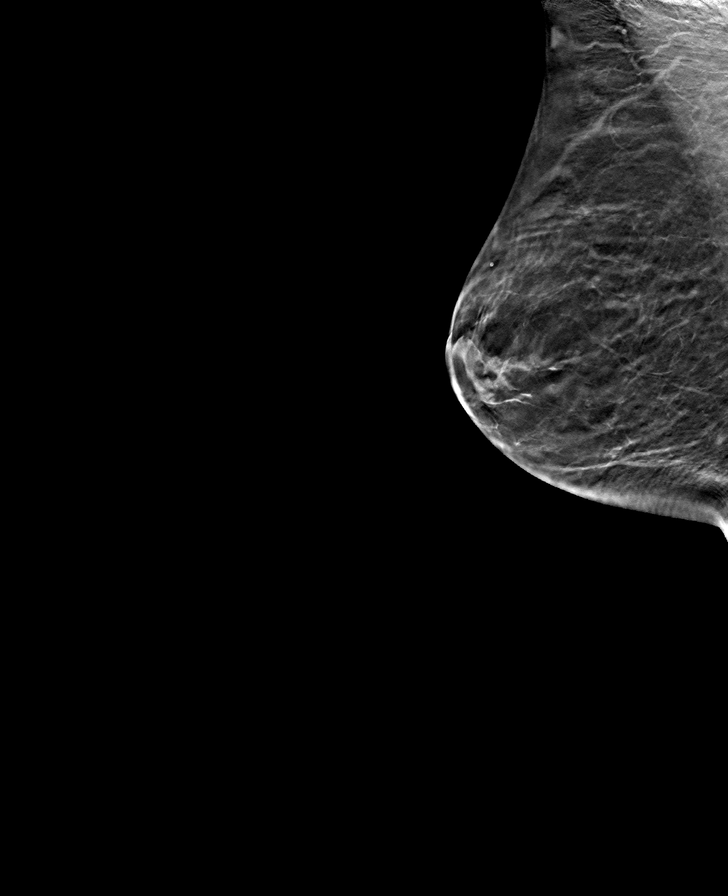

[8 of 24 positions shown; findings below may reference images not displayed]

ACR Breast Density Category b: There are scattered areas of
fibroglandular density.
FINDINGS: There are no findings suspicious for malignancy. Images were
processed with CAD.
IMPRESSION: No mammographic evidence of malignancy. A result letter of this
screening mammogram will be mailed directly to the patient.

RECOMMENDATION:
Screening mammogram in one year. (Code:[id number])

BI-RADS CATEGORY  1: Negative.

## 2020-01-04 ENCOUNTER — Ambulatory Visit: Payer: Medicare Other | Attending: Internal Medicine

## 2020-01-04 DIAGNOSIS — Z23 Encounter for immunization: Secondary | ICD-10-CM

## 2020-01-04 NOTE — Progress Notes (Signed)
   Covid-19 Vaccination Clinic  Name:  DELPHINA SCHUM    MRN: [id number] DOB: [birth year]  01/04/2020  Ms. Fleener was observed post Covid-19 immunization for 15 minutes without incident. She was provided with Vaccine Information Sheet and instruction to access the V-Safe system.   Ms. Fanguy was instructed to call 911 with any severe reactions post vaccine: Marland Kitchen Difficulty breathing  . Swelling of face and throat  . A fast heartbeat  . A bad rash all over body  . Dizziness and weakness   Immunizations Administered    Name Date Dose VIS Date Route   Moderna COVID-19 Vaccine 01/04/2020  8:49 AM 0.5 mL 08/30/2019 Intramuscular   Manufacturer: Gala Murdoch   Lot: [id number]   NDC: 91444-584-83

## 2020-03-07 DIAGNOSIS — R000 Tachycardia, unspecified: Secondary | ICD-10-CM | POA: Diagnosis not present

## 2020-03-07 DIAGNOSIS — Z1389 Encounter for screening for other disorder: Secondary | ICD-10-CM | POA: Diagnosis not present

## 2020-03-07 DIAGNOSIS — I10 Essential (primary) hypertension: Secondary | ICD-10-CM | POA: Diagnosis not present

## 2020-03-07 DIAGNOSIS — E559 Vitamin D deficiency, unspecified: Secondary | ICD-10-CM | POA: Diagnosis not present

## 2020-03-07 DIAGNOSIS — E7849 Other hyperlipidemia: Secondary | ICD-10-CM | POA: Diagnosis not present

## 2020-03-07 DIAGNOSIS — E663 Overweight: Secondary | ICD-10-CM | POA: Diagnosis not present

## 2020-03-07 DIAGNOSIS — Z0000 Encounter for general adult medical examination without abnormal findings: Secondary | ICD-10-CM | POA: Diagnosis not present

## 2020-03-07 DIAGNOSIS — Z6826 Body mass index (BMI) 26.0-26.9, adult: Secondary | ICD-10-CM | POA: Diagnosis not present

## 2020-03-07 DIAGNOSIS — E119 Type 2 diabetes mellitus without complications: Secondary | ICD-10-CM | POA: Diagnosis not present

## 2020-05-04 ENCOUNTER — Other Ambulatory Visit (HOSPITAL_COMMUNITY): Payer: Self-pay | Admitting: Family Medicine

## 2020-05-04 DIAGNOSIS — Z1231 Encounter for screening mammogram for malignant neoplasm of breast: Secondary | ICD-10-CM

## 2020-05-16 ENCOUNTER — Ambulatory Visit (HOSPITAL_COMMUNITY)
Admission: RE | Admit: 2020-05-16 | Discharge: 2020-05-16 | Disposition: A | Discharge status: Home / Self Care | Payer: Medicare Other | Source: Ambulatory Visit | Attending: Family Medicine | Admitting: Family Medicine

## 2020-05-16 ENCOUNTER — Other Ambulatory Visit: Payer: Self-pay

## 2020-05-16 DIAGNOSIS — Z1231 Encounter for screening mammogram for malignant neoplasm of breast: Secondary | ICD-10-CM | POA: Diagnosis not present

## 2020-08-01 DIAGNOSIS — E119 Type 2 diabetes mellitus without complications: Secondary | ICD-10-CM | POA: Diagnosis not present

## 2020-08-05 DIAGNOSIS — Z23 Encounter for immunization: Secondary | ICD-10-CM | POA: Diagnosis not present

## 2020-09-15 DIAGNOSIS — Z23 Encounter for immunization: Secondary | ICD-10-CM | POA: Diagnosis not present

## 2020-10-03 ENCOUNTER — Other Ambulatory Visit (HOSPITAL_COMMUNITY): Payer: Self-pay | Admitting: Family Medicine

## 2020-10-03 DIAGNOSIS — E7849 Other hyperlipidemia: Secondary | ICD-10-CM | POA: Diagnosis not present

## 2020-10-03 DIAGNOSIS — E663 Overweight: Secondary | ICD-10-CM | POA: Diagnosis not present

## 2020-10-03 DIAGNOSIS — I10 Essential (primary) hypertension: Secondary | ICD-10-CM | POA: Diagnosis not present

## 2020-10-03 DIAGNOSIS — Z6825 Body mass index (BMI) 25.0-25.9, adult: Secondary | ICD-10-CM | POA: Diagnosis not present

## 2020-10-03 DIAGNOSIS — E559 Vitamin D deficiency, unspecified: Secondary | ICD-10-CM | POA: Diagnosis not present

## 2020-10-03 DIAGNOSIS — E119 Type 2 diabetes mellitus without complications: Secondary | ICD-10-CM | POA: Diagnosis not present

## 2020-10-03 DIAGNOSIS — Z1331 Encounter for screening for depression: Secondary | ICD-10-CM | POA: Diagnosis not present

## 2020-10-03 DIAGNOSIS — E2839 Other primary ovarian failure: Secondary | ICD-10-CM

## 2020-10-03 DIAGNOSIS — Z1389 Encounter for screening for other disorder: Secondary | ICD-10-CM | POA: Diagnosis not present

## 2020-10-12 ENCOUNTER — Other Ambulatory Visit: Payer: Self-pay

## 2020-10-12 ENCOUNTER — Ambulatory Visit (HOSPITAL_COMMUNITY)
Admission: RE | Admit: 2020-10-12 | Discharge: 2020-10-12 | Disposition: A | Discharge status: Home / Self Care | Payer: Medicare Other | Source: Ambulatory Visit | Attending: Family Medicine | Admitting: Family Medicine

## 2020-10-12 DIAGNOSIS — Z780 Asymptomatic menopausal state: Secondary | ICD-10-CM | POA: Diagnosis not present

## 2020-10-12 DIAGNOSIS — E2839 Other primary ovarian failure: Secondary | ICD-10-CM | POA: Insufficient documentation

## 2020-10-12 DIAGNOSIS — R29890 Loss of height: Secondary | ICD-10-CM | POA: Diagnosis not present

## 2020-10-12 DIAGNOSIS — Z8739 Personal history of other diseases of the musculoskeletal system and connective tissue: Secondary | ICD-10-CM | POA: Diagnosis not present

## 2020-10-18 DIAGNOSIS — X32XXXA Exposure to sunlight, initial encounter: Secondary | ICD-10-CM | POA: Diagnosis not present

## 2020-10-18 DIAGNOSIS — L570 Actinic keratosis: Secondary | ICD-10-CM | POA: Diagnosis not present

## 2021-04-17 DIAGNOSIS — Z0001 Encounter for general adult medical examination with abnormal findings: Secondary | ICD-10-CM | POA: Diagnosis not present

## 2021-04-17 DIAGNOSIS — I10 Essential (primary) hypertension: Secondary | ICD-10-CM | POA: Diagnosis not present

## 2021-04-17 DIAGNOSIS — E559 Vitamin D deficiency, unspecified: Secondary | ICD-10-CM | POA: Diagnosis not present

## 2021-04-17 DIAGNOSIS — E663 Overweight: Secondary | ICD-10-CM | POA: Diagnosis not present

## 2021-04-17 DIAGNOSIS — E119 Type 2 diabetes mellitus without complications: Secondary | ICD-10-CM | POA: Diagnosis not present

## 2021-04-17 DIAGNOSIS — Z6826 Body mass index (BMI) 26.0-26.9, adult: Secondary | ICD-10-CM | POA: Diagnosis not present

## 2021-04-17 DIAGNOSIS — Z1331 Encounter for screening for depression: Secondary | ICD-10-CM | POA: Diagnosis not present

## 2021-04-17 DIAGNOSIS — E782 Mixed hyperlipidemia: Secondary | ICD-10-CM | POA: Diagnosis not present

## 2021-08-15 DIAGNOSIS — Z23 Encounter for immunization: Secondary | ICD-10-CM | POA: Diagnosis not present

## 2021-08-30 ENCOUNTER — Other Ambulatory Visit (HOSPITAL_COMMUNITY): Payer: Self-pay | Admitting: Family Medicine

## 2021-08-30 DIAGNOSIS — Z1231 Encounter for screening mammogram for malignant neoplasm of breast: Secondary | ICD-10-CM

## 2021-09-12 ENCOUNTER — Other Ambulatory Visit: Payer: Self-pay

## 2021-09-12 ENCOUNTER — Ambulatory Visit (HOSPITAL_COMMUNITY)
Admission: RE | Admit: 2021-09-12 | Discharge: 2021-09-12 | Disposition: A | Discharge status: Home / Self Care | Payer: Medicare Other | Source: Ambulatory Visit | Attending: Family Medicine | Admitting: Family Medicine

## 2021-09-12 DIAGNOSIS — Z1231 Encounter for screening mammogram for malignant neoplasm of breast: Secondary | ICD-10-CM | POA: Insufficient documentation

## 2021-09-16 ENCOUNTER — Other Ambulatory Visit (HOSPITAL_COMMUNITY): Payer: Self-pay | Admitting: Family Medicine

## 2021-09-16 DIAGNOSIS — R928 Other abnormal and inconclusive findings on diagnostic imaging of breast: Secondary | ICD-10-CM

## 2021-09-17 DIAGNOSIS — Z23 Encounter for immunization: Secondary | ICD-10-CM | POA: Diagnosis not present

## 2021-10-03 ENCOUNTER — Encounter (HOSPITAL_COMMUNITY): Payer: Medicare Other

## 2021-10-03 ENCOUNTER — Ambulatory Visit (HOSPITAL_COMMUNITY): Payer: Medicare Other

## 2021-10-15 ENCOUNTER — Ambulatory Visit (HOSPITAL_COMMUNITY)
Admission: RE | Admit: 2021-10-15 | Discharge: 2021-10-15 | Disposition: A | Discharge status: Home / Self Care | Payer: Medicare Other | Source: Ambulatory Visit | Attending: Family Medicine | Admitting: Family Medicine

## 2021-10-15 ENCOUNTER — Other Ambulatory Visit: Payer: Self-pay

## 2021-10-15 ENCOUNTER — Encounter (HOSPITAL_COMMUNITY): Payer: Self-pay

## 2021-10-15 DIAGNOSIS — R922 Inconclusive mammogram: Secondary | ICD-10-CM | POA: Diagnosis not present

## 2021-10-15 DIAGNOSIS — R928 Other abnormal and inconclusive findings on diagnostic imaging of breast: Secondary | ICD-10-CM | POA: Diagnosis not present

## 2022-05-05 DIAGNOSIS — H47311 Coloboma of optic disc, right eye: Secondary | ICD-10-CM | POA: Diagnosis not present

## 2022-06-05 DIAGNOSIS — E119 Type 2 diabetes mellitus without complications: Secondary | ICD-10-CM | POA: Diagnosis not present

## 2022-06-05 DIAGNOSIS — E559 Vitamin D deficiency, unspecified: Secondary | ICD-10-CM | POA: Diagnosis not present

## 2022-06-05 DIAGNOSIS — E7849 Other hyperlipidemia: Secondary | ICD-10-CM | POA: Diagnosis not present

## 2022-06-05 DIAGNOSIS — Z0001 Encounter for general adult medical examination with abnormal findings: Secondary | ICD-10-CM | POA: Diagnosis not present

## 2022-06-05 DIAGNOSIS — Z6825 Body mass index (BMI) 25.0-25.9, adult: Secondary | ICD-10-CM | POA: Diagnosis not present

## 2022-06-05 DIAGNOSIS — Z1331 Encounter for screening for depression: Secondary | ICD-10-CM | POA: Diagnosis not present

## 2022-06-05 DIAGNOSIS — Z23 Encounter for immunization: Secondary | ICD-10-CM | POA: Diagnosis not present

## 2022-06-05 DIAGNOSIS — I10 Essential (primary) hypertension: Secondary | ICD-10-CM | POA: Diagnosis not present

## 2022-06-05 DIAGNOSIS — E782 Mixed hyperlipidemia: Secondary | ICD-10-CM | POA: Diagnosis not present

## 2022-08-11 DIAGNOSIS — Z23 Encounter for immunization: Secondary | ICD-10-CM | POA: Diagnosis not present

## 2022-09-04 ENCOUNTER — Other Ambulatory Visit (HOSPITAL_COMMUNITY): Payer: Self-pay | Admitting: Family Medicine

## 2022-09-04 DIAGNOSIS — Z1231 Encounter for screening mammogram for malignant neoplasm of breast: Secondary | ICD-10-CM

## 2022-10-13 ENCOUNTER — Ambulatory Visit (HOSPITAL_COMMUNITY): Payer: Medicare Other

## 2022-12-16 ENCOUNTER — Encounter: Payer: Self-pay | Admitting: *Deleted

## 2022-12-16 ENCOUNTER — Encounter (INDEPENDENT_AMBULATORY_CARE_PROVIDER_SITE_OTHER): Payer: Self-pay | Admitting: *Deleted

## 2023-06-09 DIAGNOSIS — H02054 Trichiasis without entropian left upper eyelid: Secondary | ICD-10-CM | POA: Diagnosis not present

## 2023-06-09 DIAGNOSIS — H43393 Other vitreous opacities, bilateral: Secondary | ICD-10-CM | POA: Diagnosis not present

## 2023-07-08 ENCOUNTER — Ambulatory Visit (HOSPITAL_COMMUNITY)
Admission: RE | Admit: 2023-07-08 | Discharge: 2023-07-08 | Disposition: A | Discharge status: Home / Self Care | Payer: Medicare Other | Source: Ambulatory Visit | Attending: Family Medicine | Admitting: Family Medicine

## 2023-07-08 DIAGNOSIS — Z1231 Encounter for screening mammogram for malignant neoplasm of breast: Secondary | ICD-10-CM | POA: Diagnosis not present

## 2023-07-24 DIAGNOSIS — J302 Other seasonal allergic rhinitis: Secondary | ICD-10-CM | POA: Diagnosis not present

## 2023-07-24 DIAGNOSIS — Z1331 Encounter for screening for depression: Secondary | ICD-10-CM | POA: Diagnosis not present

## 2023-07-24 DIAGNOSIS — E119 Type 2 diabetes mellitus without complications: Secondary | ICD-10-CM | POA: Diagnosis not present

## 2023-07-24 DIAGNOSIS — E7849 Other hyperlipidemia: Secondary | ICD-10-CM | POA: Diagnosis not present

## 2023-07-24 DIAGNOSIS — I10 Essential (primary) hypertension: Secondary | ICD-10-CM | POA: Diagnosis not present

## 2023-07-24 DIAGNOSIS — M810 Age-related osteoporosis without current pathological fracture: Secondary | ICD-10-CM | POA: Diagnosis not present

## 2023-07-24 DIAGNOSIS — Z0001 Encounter for general adult medical examination with abnormal findings: Secondary | ICD-10-CM | POA: Diagnosis not present

## 2023-07-24 DIAGNOSIS — E663 Overweight: Secondary | ICD-10-CM | POA: Diagnosis not present

## 2023-07-24 DIAGNOSIS — E1159 Type 2 diabetes mellitus with other circulatory complications: Secondary | ICD-10-CM | POA: Diagnosis not present

## 2023-07-24 DIAGNOSIS — Z6825 Body mass index (BMI) 25.0-25.9, adult: Secondary | ICD-10-CM | POA: Diagnosis not present

## 2023-07-24 DIAGNOSIS — E782 Mixed hyperlipidemia: Secondary | ICD-10-CM | POA: Diagnosis not present

## 2023-07-24 DIAGNOSIS — E559 Vitamin D deficiency, unspecified: Secondary | ICD-10-CM | POA: Diagnosis not present

## 2023-09-07 ENCOUNTER — Encounter (INDEPENDENT_AMBULATORY_CARE_PROVIDER_SITE_OTHER): Payer: Self-pay | Admitting: Otolaryngology

## 2023-10-05 ENCOUNTER — Telehealth (INDEPENDENT_AMBULATORY_CARE_PROVIDER_SITE_OTHER): Payer: Self-pay | Admitting: Otolaryngology

## 2023-10-05 NOTE — Telephone Encounter (Signed)
 Patient called to confirm appt for 01/13. Appt marked as confirmed.

## 2023-10-12 ENCOUNTER — Institutional Professional Consult (permissible substitution) (INDEPENDENT_AMBULATORY_CARE_PROVIDER_SITE_OTHER): Payer: Medicare Other

## 2023-11-09 ENCOUNTER — Telehealth (INDEPENDENT_AMBULATORY_CARE_PROVIDER_SITE_OTHER): Payer: Self-pay | Admitting: Otolaryngology

## 2023-11-09 NOTE — Telephone Encounter (Signed)
 Confirmed appt and address with patient.

## 2023-11-10 ENCOUNTER — Institutional Professional Consult (permissible substitution) (INDEPENDENT_AMBULATORY_CARE_PROVIDER_SITE_OTHER): Payer: Medicare Other

## 2023-12-31 ENCOUNTER — Ambulatory Visit (INDEPENDENT_AMBULATORY_CARE_PROVIDER_SITE_OTHER): Payer: Medicare Other | Admitting: Otolaryngology

## 2023-12-31 ENCOUNTER — Encounter (INDEPENDENT_AMBULATORY_CARE_PROVIDER_SITE_OTHER): Payer: Self-pay

## 2023-12-31 VITALS — BP 156/84 | HR 105 | Ht 64.0 in | Wt 143.3 lb

## 2023-12-31 DIAGNOSIS — R0981 Nasal congestion: Secondary | ICD-10-CM

## 2023-12-31 DIAGNOSIS — H9190 Unspecified hearing loss, unspecified ear: Secondary | ICD-10-CM

## 2023-12-31 DIAGNOSIS — H918X3 Other specified hearing loss, bilateral: Secondary | ICD-10-CM

## 2023-12-31 DIAGNOSIS — J343 Hypertrophy of nasal turbinates: Secondary | ICD-10-CM | POA: Diagnosis not present

## 2023-12-31 MED ORDER — FLUTICASONE PROPIONATE 50 MCG/ACT NA SUSP: 2.0000 | Freq: Two times a day (BID) | NASAL | 6 refills | Status: AC

## 2023-12-31 MED ORDER — AZELASTINE HCL 0.1 % NA SOLN: 2.0000 | Freq: Two times a day (BID) | NASAL | 12 refills | Status: AC

## 2023-12-31 NOTE — Patient Instructions (Addendum)
 Use flonase two sprays each nostril twice per day and right after, use astelin nasal spray two sprays each nostril twice per day -- try for 2 weeks, if not working, can stop.   I have ordered an imaging study for you to complete prior to your next visit. Please call [facility] Scheduling at [phone] to schedule your imaging if you have not received a call within 24 hours. If you are unable to complete your imaging study prior to your next scheduled visit please call our office to let us  know.

## 2023-12-31 NOTE — Progress Notes (Signed)
 Dear Dr. Glady Laming, Here is my assessment for our mutual patient, Bethany Herring. Thank you for allowing me the opportunity to care for your patient. Please do not hesitate to contact me should you have any other questions. Sincerely, Dr. Milon Aloe  Otolaryngology Clinic Note  HISTORY: Bethany Herring is a 83 y.o. female kindly referred by Dr. Glady Laming for evaluation of hearing loss, balance issues and post nasal drip.  Initial visit (12/2023): Patient reports: noted left ear hearing loss which she has noted over the past few months, and also noted balance issues chronic which are not related. She reports that she can stand and walk without issue, but going over stairs or when she moves her head, she feels imbalance. She feels like she is falling more. She denies vertigo symptoms. In addition, she reports that she has bilateral ear fullness - "head feels congested." She does not report ear infections or additional otologic symptoms. Denies frank headaches, weakness, or other symptoms such as numbness. No recent formal hearing test No recent med changes or loud noise exposure  Patient denies: ear pain, frank vertigo, drainage, persistent or roaring tinnitus Patient additionally denies: deep pain in ear canal, eustachian tube symptoms such as popping/crackling Patient also denies barotrauma, vestibular suppressant use, ototoxic medication use Prior ear surgery: no  Does report history of allergies, and she reports that she does sneeze and sniffling. Also reports general chronic tiredness, but otherwise denies CRS symptoms or frequent sinus infections  She is currently using no nasal meds.  GLP-1: no AP/AC: no  Tobacco: never  PMHx: HTN, possible preDM  RADIOGRAPHIC EVALUATION AND INDEPENDENT REVIEW OF OTHER RECORDS: Dr. Glady Laming referral notes [facility]) referral notes reviewed and uploaded or available in chart in media tab (07/24/2023): noted rhinorrhea, nasal congestion, itching of nose, PND;  no sinus pressure or ear pressure; Dx: AR; Rx: Ref to ENT   Past Medical History:  Diagnosis Date   Diabetes mellitus    Diet controlled   Hypertension    Osteoporosis    Seasonal allergies    Past Surgical History:  Procedure Laterality Date   CHOLECYSTECTOMY     COLONOSCOPY     COLONOSCOPY N/A 12/30/2012   Procedure: COLONOSCOPY;  Surgeon: Ruby Corporal, MD;  Location[facility];  Service: Endoscopy;  Laterality: N/A;  830   Family History  Problem Relation Age of Onset   Colon cancer Mother    Social History   Tobacco Use   Smoking status: Never   Smokeless tobacco: Not on file  Substance Use Topics   Alcohol use: Yes    Comment: occasionally   Allergies  Allergen Reactions   Amoxicillin Hives   Cortisone     Highly elevated blood sugars    Glucotrol [Glipizide]    Current Outpatient Medications  Medication Sig Dispense Refill   azelastine  (ASTELIN ) 0.1 % nasal spray Place 2 sprays into both nostrils 2 (two) times daily. Use in each nostril as directed 30 mL 12   cholecalciferol (VITAMIN D) 400 UNITS TABS Take 400 Units by mouth daily.       fluticasone  (FLONASE ) 50 MCG/ACT nasal spray Place 2 sprays into both nostrils in the morning and at bedtime. 16 g 6   lisinopril (PRINIVIL,ZESTRIL) 5 MG tablet Take 5 mg by mouth daily.       calcium carbonate (TUMS - DOSED IN MG ELEMENTAL CALCIUM) 500 MG chewable tablet Chew 1 tablet by mouth 2 (two) times daily as needed for heartburn. (Patient not taking:  Reported on 12/31/2023)     CRANBERRY PO Take 1 tablet by mouth daily.     OVER THE COUNTER MEDICATION Take 5 mLs by mouth daily. 1 teaspoon before breakfast. Powdered form Raw Barley grass     Probiotic Product (ALIGN) 4 MG CAPS Take 1 tablet by mouth daily.       vitamin C (ASCORBIC ACID) 500 MG tablet Take 500 mg by mouth daily.       No current facility-administered medications for this visit.   BP (!) 156/84 (BP Location: Left Arm, Patient Position:  Standing;Sitting, Cuff Size: Normal)   Pulse (!) 105   Ht 5\' 4"  (1.626 m)   Wt 143 lb 4.8 oz (65 kg)   SpO2 96%   BMI 24.60 kg/m   PHYSICAL EXAM:  BP (!) 156/84 (BP Location: Left Arm, Patient Position: Standing;Sitting, Cuff Size: Normal)   Pulse (!) 105   Ht 5\' 4"  (1.626 m)   Wt 143 lb 4.8 oz (65 kg)   SpO2 96%   BMI 24.60 kg/m    Salient findings:  CN II-XII intact - chronic blindness right eye Bilateral EAC clear and TM intact with well pneumatized middle ear spaces Weber 512: mid Rinne 512: AC > BC b/l  No gross nystagmus, gait not broad based Nose: Anterior rhinoscopy reveals septum relatively midline, modest turbinate hypertrophy bilateral.  No lesions of oral cavity/oropharynx No obviously palpable neck masses/lymphadenopathy/thyromegaly No respiratory distress or stridor   ASSESSMENT:  83 y.o. F now with:  1. Asymmetrical hearing loss   2. Subjective hearing loss   3. Nasal congestion   4. Hypertrophy of both inferior nasal turbinates    Multiple issues, primarily ear related including left hearing loss and imbalance. Denies frank vertigo. She does not have any other otologic sx and given her sx are occurring while taking stairs and feeling of imbalance/falling as well as her medical history, suspect this may not be peripheral ear etiology but rather multifactorial. We discussed w/u incl vestib testing, audio, referral to neuro/cards but patient would like to start with audio. For congestion, will start with medical management. If fails, can consider endo  PLAN: We've discussed issues and options today. The risks, benefits and alternatives were discussed and questions answered.  She has elected to proceed with:  1) Start flonase  and astelin  BID 2) Audiogram 2) Follow-up in 8-12 weeks -- sooner as necessary.  See below regarding exact medications prescribed this encounter including dosages and route: Meds ordered this encounter  Medications   fluticasone   (FLONASE ) 50 MCG/ACT nasal spray    Sig: Place 2 sprays into both nostrils in the morning and at bedtime.    Dispense:  16 g    Refill:  6   azelastine  (ASTELIN ) 0.1 % nasal spray    Sig: Place 2 sprays into both nostrils 2 (two) times daily. Use in each nostril as directed    Dispense:  30 mL    Refill:  12   Addendum: Audiogram reviewed from 01/11/2024: noted A/A tymps and asymmetric SNHL; given this, will get MRI IAC and follow up in 2 months.  Thank you for allowing me the opportunity to care for your patient. Please do not hesitate to contact me should you have any other questions.  Sincerely, Milon Aloe, MD Otolaryngologist (ENT), [facility] Phone: [phone] Fax: [phone]  MDM:  Level 4: [id number] Complexity/Problems addressed: mod - multiple chronic problems Data complexity: low - independent interpretation of notes - Morbidity: mod  -  Prescription Drug prescribed or managed: yes  01/18/2024, 8:52 AM

## 2024-01-11 ENCOUNTER — Ambulatory Visit (INDEPENDENT_AMBULATORY_CARE_PROVIDER_SITE_OTHER): Admitting: Audiology

## 2024-01-11 DIAGNOSIS — H903 Sensorineural hearing loss, bilateral: Secondary | ICD-10-CM | POA: Diagnosis not present

## 2024-01-11 NOTE — Progress Notes (Unsigned)
  [address], [address] [place], [place] [address] [phone]  Audiological Evaluation    Name: Bethany Herring     DOB:   02-08-1941      MRN:   [id number]                                                                                     Service Date: 01/11/2024     Accompanied by: unaccompanied   Patient comes today after Dr. Suszanne Herring, ENT sent a referral for a hearing evaluation due to concerns with hearing loss.   Symptoms Yes Details  Hearing loss  [x]  Left sided hearing loss with onset maybe around 1 year ago- noted when she went to her yearly physical examination. Patient reports she missed the 2 lowest pitches during the hearing screen in the left ear.  Tinnitus  [x]  Longstanding - intermittent, usually left ear ( sometimes sounds is like a buzzing).  Ear pain/ infections/pressure  [x]  Reports head congestion   Balance problems  [x]  Reports that she has always had balance issues triggered by quick movements. Reports blind right eye.  Noise exposure history  []    Previous ear surgeries  []    Family history of hearing loss  [x]  Sister with age  Amplification  []    Other  [x]  Excessive fatigue    Otoscopy: Right ear: Clear external ear canals and notable landmarks visualized on the tympanic membrane. Left ear:  Clear external ear canals and notable landmarks visualized on the tympanic membrane.  Tympanometry: Right ear: Type A- Normal external ear canal volume with normal middle ear pressure and tympanic membrane compliance Left ear: Type A- Normal external ear canal volume with normal middle ear pressure and tympanic membrane compliance   Pure tone Audiometry: Right ear- Normal hearing from 125-200 Hz, then mild to moderate sensorineural hearing loss from 3000-8000 Hz. Left ear-  Moderate to moderately severe essentially sensorineural hearing loss from [phone] Hz, rising to mild sensorineural hearing loss from 2000-3000 Hz, then sloping sensorineural hearing loss from  4000-8000 Hz.   Speech Audiometry: Right ear- Speech Reception Threshold (SRT) was obtained at 20 dBHL. Left ear-Speech Reception Threshold (SRT) was obtained at 50 dBHL, with contralateral masking.   Word Recognition Score Tested using NU-6 (MLV) Right ear: 88% was obtained at a presentation level of 65 dBHL with contralateral masking which is deemed as  good . Left ear: 84% was obtained at a presentation level of 80 dBHL with contralateral masking which is deemed as  good .   The hearing test results were completed under headphones and results are deemed to be of good to fair reliability. Test technique:  conventional    Impression:  There is a significant difference in pure-tone thresholds between ears, worse in the left ear (mainly from [phone] Hz)   Recommendations: Follow up with ENT as scheduled for today. Return for a hearing evaluation if concerns with hearing changes arise or per MD recommendation. Consider a communication needs assessment after medical clearance for hearing aids is obtained, pending patient motivation ( patient said has no communication issues).   Bethany Herring Bethany Herring, AUD

## 2024-01-14 ENCOUNTER — Telehealth (INDEPENDENT_AMBULATORY_CARE_PROVIDER_SITE_OTHER): Payer: Self-pay | Admitting: Otolaryngology

## 2024-01-14 NOTE — Telephone Encounter (Signed)
 Patient called and asked if she has to wait until her 6 week follow up to get the results of the MRI on 01/24/24 or if you will call her with results after you have received them? Please advise

## 2024-01-14 NOTE — Telephone Encounter (Signed)
 Would you mind letting her know that I will call her with it. The reason the follow up is a bit later is because it can take up to 4 weeks for the radiologist to give us  an official reading on the MRI so as soon as that comes back, I will also look at it and call her to let her know. Thank you

## 2024-01-18 ENCOUNTER — Encounter (INDEPENDENT_AMBULATORY_CARE_PROVIDER_SITE_OTHER): Payer: Self-pay

## 2024-01-24 ENCOUNTER — Ambulatory Visit (HOSPITAL_COMMUNITY)
Admission: RE | Admit: 2024-01-24 | Discharge: 2024-01-24 | Disposition: A | Discharge status: Home / Self Care | Source: Ambulatory Visit | Attending: Otolaryngology | Admitting: Otolaryngology

## 2024-01-24 DIAGNOSIS — H918X3 Other specified hearing loss, bilateral: Secondary | ICD-10-CM

## 2024-01-24 MED ORDER — GADOBUTROL 1 MMOL/ML IV SOLN
6.0000 mL | Freq: Once | INTRAVENOUS | Status: AC | PRN
  Administered 2024-01-24: 6 mL via INTRAVENOUS

## 2024-02-05 ENCOUNTER — Telehealth (INDEPENDENT_AMBULATORY_CARE_PROVIDER_SITE_OTHER): Payer: Self-pay | Admitting: Otolaryngology

## 2024-02-05 NOTE — Telephone Encounter (Signed)
 Patient called in wanting to know the results of the imaging studies.  Please advise.  [phone]

## 2024-02-05 NOTE — Telephone Encounter (Signed)
 Spoke to patient and informed her of providers note.Patient verbalizes understanding.

## 2024-03-15 ENCOUNTER — Encounter (INDEPENDENT_AMBULATORY_CARE_PROVIDER_SITE_OTHER): Payer: Self-pay | Admitting: Otolaryngology

## 2024-03-15 ENCOUNTER — Ambulatory Visit (INDEPENDENT_AMBULATORY_CARE_PROVIDER_SITE_OTHER): Admitting: Otolaryngology

## 2024-03-15 VITALS — BP 169/77 | HR 94

## 2024-03-15 DIAGNOSIS — R2689 Other abnormalities of gait and mobility: Secondary | ICD-10-CM

## 2024-03-15 DIAGNOSIS — R0981 Nasal congestion: Secondary | ICD-10-CM

## 2024-03-15 DIAGNOSIS — J343 Hypertrophy of nasal turbinates: Secondary | ICD-10-CM | POA: Diagnosis not present

## 2024-03-15 DIAGNOSIS — H903 Sensorineural hearing loss, bilateral: Secondary | ICD-10-CM | POA: Diagnosis not present

## 2024-03-15 NOTE — Progress Notes (Signed)
 Dear Dr. Glady Laming, Here is my assessment for our mutual patient, Bethany Herring. Thank you for allowing me the opportunity to care for your patient. Please do not hesitate to contact me should you have any other questions. Sincerely, Dr. Milon Aloe  Otolaryngology Clinic Note  HISTORY: Bethany Herring is a 83 y.o. female kindly referred by Dr. Glady Laming for evaluation of hearing loss, balance issues and post nasal drip.  Initial visit (12/2023): Patient reports: noted left ear hearing loss which she has noted over the past few months, and also noted balance issues chronic which are not related. She reports that she can stand and walk without issue, but going over stairs or when she moves her head, she feels imbalance. She feels like she is falling more. She denies vertigo symptoms. In addition, she reports that she has bilateral ear fullness - head feels congested. She does not report ear infections or additional otologic symptoms. Denies frank headaches, weakness, or other symptoms such as numbness. No recent formal hearing test No recent med changes or loud noise exposure  Patient denies: ear pain, frank vertigo, drainage, persistent or roaring tinnitus Patient additionally denies: deep pain in ear canal, eustachian tube symptoms such as popping/crackling Patient also denies barotrauma, vestibular suppressant use, ototoxic medication use Prior ear surgery: no  Does report history of allergies, and she reports that she does sneeze and sniffling. Also reports general chronic tiredness, but otherwise denies CRS symptoms or frequent sinus infections  She is currently using no nasal meds.  --------------------------------------------------------- 03/15/2024 Returns for follow up. Persistent left hearing loss, balance issues intermittent without frank vertigo. Ear fullness has resolved and no ear infections or otologic symptoms. She is on flonase  and astelin . We discussed her MRI and audio  results.   GLP-1: no AP/AC: no  Tobacco: never  PMHx: HTN, possible preDM  RADIOGRAPHIC EVALUATION AND INDEPENDENT REVIEW OF OTHER RECORDS: Dr. Glady Laming referral notes [facility]) referral notes reviewed and uploaded or available in chart in media tab (07/24/2023): noted rhinorrhea, nasal congestion, itching of nose, PND; no sinus pressure or ear pressure; Dx: AR; Rx: Ref to ENT MRI Brain w/wo 01/24/2024 independently interpreted with respect to ears: mastoids and ME without effusion; no retrocochlear lesion noted Audiogram reviewed from 01/11/2024: noted A/A tymps and asymmetric SNHL left Right ear- Normal hearing from 125-200 Hz, then mild to moderate sensorineural hearing loss from 3000-8000 Hz. Left ear-  Moderate to moderately severe essentially sensorineural hearing loss from [phone] Hz, rising to mild sensorineural hearing loss from 2000-3000 Hz, then sloping sensorineural hearing loss from 4000-8000 Hz.   Past Medical History:  Diagnosis Date   Diabetes mellitus    Diet controlled   Hypertension    Osteoporosis    Seasonal allergies    Past Surgical History:  Procedure Laterality Date   CHOLECYSTECTOMY     COLONOSCOPY     COLONOSCOPY N/A 12/30/2012   Procedure: COLONOSCOPY;  Surgeon: Ruby Corporal, MD;  Location: [facility];  Service: Endoscopy;  Laterality: N/A;  830   Family History  Problem Relation Age of Onset   Colon cancer Mother    Social History   Tobacco Use   Smoking status: Never   Smokeless tobacco: Not on file  Substance Use Topics   Alcohol use: Yes    Comment: occasionally   Allergies  Allergen Reactions   Amoxicillin Hives   Cortisone     Highly elevated blood sugars    Glucotrol [Glipizide]    Current Outpatient Medications  Medication Sig Dispense Refill   azelastine  (ASTELIN ) 0.1 % nasal spray Place 2 sprays into both nostrils 2 (two) times daily. Use in each nostril as directed 30 mL 12   cholecalciferol (VITAMIN D) 400 UNITS TABS Take 400  Units by mouth daily.       cholecalciferol (VITAMIN D3) 25 MCG (1000 UNIT) tablet Take 1,000 Units by mouth daily.     fluticasone  (FLONASE ) 50 MCG/ACT nasal spray Place 2 sprays into both nostrils in the morning and at bedtime. 16 g 6   lisinopril (PRINIVIL,ZESTRIL) 5 MG tablet Take 5 mg by mouth 2 (two) times daily.     OVER THE COUNTER MEDICATION Take 5 mLs by mouth daily. 1 teaspoon before breakfast. Powdered form Raw Barley grass     calcium carbonate (TUMS - DOSED IN MG ELEMENTAL CALCIUM) 500 MG chewable tablet Chew 1 tablet by mouth 2 (two) times daily as needed for heartburn. (Patient not taking: Reported on 03/15/2024)     CRANBERRY PO Take 1 tablet by mouth daily. (Patient not taking: Reported on 03/15/2024)     Probiotic Product (ALIGN) 4 MG CAPS Take 1 tablet by mouth daily.   (Patient not taking: Reported on 03/15/2024)     vitamin C (ASCORBIC ACID) 500 MG tablet Take 500 mg by mouth daily.   (Patient not taking: Reported on 03/15/2024)     No current facility-administered medications for this visit.   BP (!) 169/77   Pulse 94   SpO2 95%   PHYSICAL EXAM:  BP (!) 169/77   Pulse 94   SpO2 95%    Salient findings:  CN II-XII intact - chronic blindness right eye Bilateral EAC clear and TM intact with well pneumatized middle ear spaces Weber 512: mid Rinne 512: AC > BC b/l  No gross nystagmus, gait not broad based No respiratory distress or stridor   ASSESSMENT:  83 y.o. F now with:  1. Sensorineural hearing loss (SNHL) of both ears   2. Asymmetric SNHL (sensorineural hearing loss)   3. Nasal congestion   4. Hypertrophy of both inferior nasal turbinates   5. Imbalance    Multiple issues, primarily ear related including left hearing loss and imbalance. Denies frank vertigo. She does not have any other otologic sx and given her sx are occurring while taking stairs and feeling of imbalance/falling as well as her medical history, suspect this may not be peripheral ear etiology  but rather multifactorial which I again stressed MRI reassuring. For her HL, would recommend audio yearly and amplification - resource sheet provided We also discussed workup including vestibular PT and testing and neuro ref but patient would like to hold off  - Continue flonase  and astelin  BID - f/u 1 year with audio  See below regarding exact medications prescribed this encounter including dosages and route: No orders of the defined types were placed in this encounter.  Thank you for allowing me the opportunity to care for your patient. Please do not hesitate to contact me should you have any other questions.  Sincerely, Milon Aloe, MD Otolaryngologist (ENT), [facility] Phone: [phone] Fax: [phone]  I have personally spent 41 minutes involved in face-to-face and non-face-to-face activities for this patient on the day of the visit.  Professional time spent excludes any procedures performed but includes the following activities, in addition to those noted in the documentation: preparing to see the patient (review of outside documentation and results), performing a medically appropriate examination, counseling, documenting in the  electronic health record, independently MRI    03/15/2024, 2:11 PM

## 2024-06-09 DIAGNOSIS — H43392 Other vitreous opacities, left eye: Secondary | ICD-10-CM | POA: Diagnosis not present

## 2024-09-12 ENCOUNTER — Other Ambulatory Visit (HOSPITAL_COMMUNITY): Payer: Self-pay | Admitting: Family Medicine

## 2024-09-12 DIAGNOSIS — Z1231 Encounter for screening mammogram for malignant neoplasm of breast: Secondary | ICD-10-CM

## 2024-10-17 ENCOUNTER — Ambulatory Visit (HOSPITAL_COMMUNITY)
Admission: RE | Admit: 2024-10-17 | Discharge: 2024-10-17 | Disposition: A | Source: Ambulatory Visit | Attending: Family Medicine | Admitting: Family Medicine

## 2024-10-17 DIAGNOSIS — Z1231 Encounter for screening mammogram for malignant neoplasm of breast: Secondary | ICD-10-CM | POA: Insufficient documentation

## 2024-10-20 ENCOUNTER — Other Ambulatory Visit (HOSPITAL_COMMUNITY): Payer: Self-pay | Admitting: Family Medicine

## 2024-10-20 DIAGNOSIS — M810 Age-related osteoporosis without current pathological fracture: Secondary | ICD-10-CM

## 2024-11-08 ENCOUNTER — Other Ambulatory Visit (HOSPITAL_COMMUNITY)
